# Patient Record
Sex: Male | Born: 1970 | Race: White | Hispanic: No | Marital: Married | Smoking: Never smoker
Health system: Southern US, Community
[De-identification: ages and names within clinical notes are randomized; demographics above are authoritative.]

## PROBLEM LIST (undated history)

## (undated) HISTORY — PX: APPENDECTOMY: SHX54

## (undated) HISTORY — PX: ANTERIOR CRUCIATE LIGAMENT REPAIR: SHX115

## (undated) HISTORY — PX: CERVICAL DISC SURGERY: SHX588

---

## 2008-08-11 ENCOUNTER — Ambulatory Visit (HOSPITAL_BASED_OUTPATIENT_CLINIC_OR_DEPARTMENT_OTHER): Admission: RE | Admit: 2008-08-11 | Discharge: 2008-08-11 | Payer: Self-pay | Admitting: Orthopedic Surgery

## 2010-08-02 ENCOUNTER — Other Ambulatory Visit: Payer: Self-pay | Admitting: Neurological Surgery

## 2010-08-02 ENCOUNTER — Ambulatory Visit
Admission: RE | Admit: 2010-08-02 | Discharge: 2010-08-02 | Disposition: A | Discharge status: Home / Self Care | Payer: BC Managed Care – PPO | Source: Ambulatory Visit | Attending: Neurological Surgery | Admitting: Neurological Surgery

## 2010-08-02 DIAGNOSIS — M47812 Spondylosis without myelopathy or radiculopathy, cervical region: Secondary | ICD-10-CM

## 2010-08-02 DIAGNOSIS — M79609 Pain in unspecified limb: Secondary | ICD-10-CM

## 2010-08-02 DIAGNOSIS — M5412 Radiculopathy, cervical region: Secondary | ICD-10-CM

## 2010-08-02 DIAGNOSIS — M5020 Other cervical disc displacement, unspecified cervical region: Secondary | ICD-10-CM

## 2010-08-10 NOTE — Op Note (Signed)
NAMENTHONY, LEFFERTS NO.:  [id number]   MEDICAL RECORD NO.:  [id number]          PATIENT TYPE:  AMB   LOCATION:  DSC                          FACILITY:  [facility]   PHYSICIAN:  Robert A. Thurston Hole, M.D. DATE OF BIRTH:  [birth year]   DATE OF PROCEDURE:  08/11/2008  DATE OF DISCHARGE:                               OPERATIVE REPORT   PREOPERATIVE DIAGNOSES:  1. Left knee medial and lateral meniscal tears.  2. Left knee anterior cruciate ligament graft partial tear.  3. Left knee chondromalacia.   POSTOPERATIVE DIAGNOSES:  1. Left knee medial and lateral meniscal tears.  2. Left knee anterior cruciate ligament graft partial tear.  3. Left knee chondromalacia.   PROCEDURE:  1. Left knee examination under anesthesia followed by arthroscopic      partial medial and lateral meniscectomies.  2. Left knee chondroplasty with partial synovectomy.   SURGEON:  Elana Alm. Thurston Hole, MD   ASSISTANT:  Julien Girt, PA   ANESTHESIA:  General.   OPERATIVE TIME:  40 minutes.   COMPLICATIONS:  None.   INDICATIONS FOR PROCEDURE:  Mr. Mangual is a 40 year old gentleman who  has had significant left knee pain for the past 1-2 years increasing in  nature with exam and MRI documenting meniscal tearing with  chondromalacia.  He underwent an ACL reconstruction approximately 3  years ago and by MRI exam the ACL reconstruction appears to be intact  although he has slight laxity in his knee.  He has otherwise failed  conservative care and is now to undergo arthroscopy.   DESCRIPTION:  Mr. Hedglin was brought to the operating room on Aug 11, 2008 after a knee block was placed in the holding by Anesthesia.  He was  placed on operating table in supine position.  He received Ancef 1 gram  IV preoperatively for prophylaxis.  After being placed under general  anesthesia, his left knee was examined.  He had full range of motion, 1  to 2+ Lachman with an endpoint.  Knee stable to varus  valgus and  posterior stress with normal patellar tracking with a slight pivot slip,  but not a pivot shift.  The left leg was prepped using sterile DuraPrep  and draped using sterile technique.  Originally through an anterolateral  portal, the arthroscope with a pump attached was placed into an  anteromedial portal and an arthroscopic probe was placed.  On initial  inspection of the medial compartment he was found to have 25% grade 3  chondromalacia which was debrided.  Medial meniscus showed partial  tearing of the posterior medial horn which 25-30% was resected back to a  stable rim.  The ACL graft showed that the posterior aspect was intact.  The anterior portion of the graft appeared to be torn and this was  debrided.  At least 50-60% of the graft remained intact.  He had 3 mm of  laxity but did have an endpoint.  Posterior cruciate was intact and  stable.  Lateral compartment inspected.  Grade 1 and 2 chondromalacia.  Lateral meniscus tear posterolateral horn in which 30%  was resected back  to a stable rim.  Popliteus tendon was intact.  Patellofemoral joint  showed grade 1 and 2 chondromalacia.  The patella tracked normally.  Moderate synovitis.  Medial and lateral gutters were debrided,  otherwise, this was free of pathology.  After this was done, it was felt  that all pathology have been satisfactorily addressed.  The instruments  were removed.  Portals were closed with 3-0 nylon suture.  Sterile  dressings were applied and the patient awakened and taken to recovery  room in stable condition.   FOLLOWUP CARE:  Mr. Donatelli will follow as an outpatient on Vicodin and  Mobic.  See me back in the office in a week for sutures out and  followup.      Robert A. Thurston Hole, M.D.  Electronically Signed     RAW/MEDQ  D:  08/11/2008  T:  08/12/2008  Job:  [id number]

## 2010-10-04 ENCOUNTER — Ambulatory Visit
Admission: RE | Admit: 2010-10-04 | Discharge: 2010-10-04 | Disposition: A | Discharge status: Home / Self Care | Payer: BC Managed Care – PPO | Source: Ambulatory Visit | Attending: Neurological Surgery | Admitting: Neurological Surgery

## 2010-10-04 ENCOUNTER — Other Ambulatory Visit: Payer: Self-pay | Admitting: Neurological Surgery

## 2010-10-04 DIAGNOSIS — M47812 Spondylosis without myelopathy or radiculopathy, cervical region: Secondary | ICD-10-CM

## 2014-01-22 ENCOUNTER — Other Ambulatory Visit: Payer: Self-pay | Admitting: Gastroenterology

## 2014-01-22 ENCOUNTER — Ambulatory Visit
Admission: RE | Admit: 2014-01-22 | Discharge: 2014-01-22 | Disposition: A | Discharge status: Home / Self Care | Payer: BC Managed Care – PPO | Source: Ambulatory Visit | Attending: Gastroenterology | Admitting: Gastroenterology

## 2014-01-22 DIAGNOSIS — K5901 Slow transit constipation: Secondary | ICD-10-CM

## 2014-07-02 ENCOUNTER — Other Ambulatory Visit: Payer: Self-pay | Admitting: Orthopedic Surgery

## 2014-07-02 DIAGNOSIS — M7122 Synovial cyst of popliteal space [Baker], left knee: Secondary | ICD-10-CM

## 2014-07-03 ENCOUNTER — Ambulatory Visit
Admission: RE | Admit: 2014-07-03 | Discharge: 2014-07-03 | Disposition: A | Discharge status: Home / Self Care | Payer: BLUE CROSS/BLUE SHIELD | Source: Ambulatory Visit | Attending: Orthopedic Surgery | Admitting: Orthopedic Surgery

## 2014-07-03 DIAGNOSIS — M7122 Synovial cyst of popliteal space [Baker], left knee: Secondary | ICD-10-CM

## 2015-01-20 ENCOUNTER — Other Ambulatory Visit: Payer: Self-pay | Admitting: Gastroenterology

## 2015-01-20 ENCOUNTER — Ambulatory Visit
Admission: RE | Admit: 2015-01-20 | Discharge: 2015-01-20 | Disposition: A | Discharge status: Home / Self Care | Payer: BLUE CROSS/BLUE SHIELD | Source: Ambulatory Visit | Attending: Gastroenterology | Admitting: Gastroenterology

## 2015-01-20 DIAGNOSIS — R1013 Epigastric pain: Secondary | ICD-10-CM

## 2015-01-20 DIAGNOSIS — R0789 Other chest pain: Secondary | ICD-10-CM

## 2015-01-22 ENCOUNTER — Ambulatory Visit
Admission: RE | Admit: 2015-01-22 | Discharge: 2015-01-22 | Disposition: A | Discharge status: Home / Self Care | Payer: BLUE CROSS/BLUE SHIELD | Source: Ambulatory Visit | Attending: Gastroenterology | Admitting: Gastroenterology

## 2015-01-22 DIAGNOSIS — R1013 Epigastric pain: Secondary | ICD-10-CM

## 2015-07-16 DIAGNOSIS — M47817 Spondylosis without myelopathy or radiculopathy, lumbosacral region: Secondary | ICD-10-CM | POA: Diagnosis not present

## 2015-07-16 DIAGNOSIS — M545 Low back pain: Secondary | ICD-10-CM | POA: Diagnosis not present

## 2015-07-23 DIAGNOSIS — M47817 Spondylosis without myelopathy or radiculopathy, lumbosacral region: Secondary | ICD-10-CM | POA: Diagnosis not present

## 2015-07-23 DIAGNOSIS — Z6831 Body mass index (BMI) 31.0-31.9, adult: Secondary | ICD-10-CM | POA: Diagnosis not present

## 2015-07-23 DIAGNOSIS — M545 Low back pain: Secondary | ICD-10-CM | POA: Diagnosis not present

## 2015-08-20 DIAGNOSIS — M47817 Spondylosis without myelopathy or radiculopathy, lumbosacral region: Secondary | ICD-10-CM | POA: Diagnosis not present

## 2015-09-04 ENCOUNTER — Encounter: Payer: Self-pay | Admitting: Emergency Medicine

## 2015-09-04 ENCOUNTER — Emergency Department (INDEPENDENT_AMBULATORY_CARE_PROVIDER_SITE_OTHER)
Admission: EM | Admit: 2015-09-04 | Discharge: 2015-09-04 | Disposition: A | Discharge status: Home / Self Care | Payer: BLUE CROSS/BLUE SHIELD | Source: Home / Self Care | Attending: Family Medicine | Admitting: Family Medicine

## 2015-09-04 DIAGNOSIS — H8110 Benign paroxysmal vertigo, unspecified ear: Secondary | ICD-10-CM

## 2015-09-04 MED ORDER — MECLIZINE HCL 25 MG PO TABS: 25.0000 mg | ORAL_TABLET | Freq: Three times a day (TID) | ORAL | Status: DC | PRN

## 2015-09-04 NOTE — ED Notes (Signed)
Patient states he awoke during night and when he rolled over he experienced extreme dizziness which worsened when he stood up. He states he has had minor episodes during night for about 2 months, but it always cleared when he stood up.

## 2015-09-04 NOTE — Discharge Instructions (Signed)
Benign Positional Vertigo Vertigo is the feeling that you or your surroundings are moving when they are not. Benign positional vertigo is the most common form of vertigo. The cause of this condition is not serious (is benign). This condition is triggered by certain movements and positions (is positional). This condition can be dangerous if it occurs while you are doing something that could endanger you or others, such as driving.  CAUSES In many cases, the cause of this condition is not known. It may be caused by a disturbance in an area of the inner ear that helps your brain to sense movement and balance. This disturbance can be caused by a viral infection (labyrinthitis), head injury, or repetitive motion. RISK FACTORS This condition is more likely to develop in:  Women.  People who are 50 years of age or older. SYMPTOMS Symptoms of this condition usually happen when you move your head or your eyes in different directions. Symptoms may start suddenly, and they usually last for less than a minute. Symptoms may include:  Loss of balance and falling.  Feeling like you are spinning or moving.  Feeling like your surroundings are spinning or moving.  Nausea and vomiting.  Blurred vision.  Dizziness.  Involuntary eye movement (nystagmus). Symptoms can be mild and cause only slight annoyance, or they can be severe and interfere with daily life. Episodes of benign positional vertigo may return (recur) over time, and they may be triggered by certain movements. Symptoms may improve over time. DIAGNOSIS This condition is usually diagnosed by medical history and a physical exam of the head, neck, and ears. You may be referred to a health care provider who specializes in ear, nose, and throat (ENT) problems (otolaryngologist) or a provider who specializes in disorders of the nervous system (neurologist). You may have additional testing, including:  MRI.  A CT scan.  Eye movement tests. Your  health care provider may ask you to change positions quickly while he or she watches you for symptoms of benign positional vertigo, such as nystagmus. Eye movement may be tested with an electronystagmogram (ENG), caloric stimulation, the Dix-Hallpike test, or the roll test.  An electroencephalogram (EEG). This records electrical activity in your brain.  Hearing tests. TREATMENT Usually, your health care provider will treat this by moving your head in specific positions to adjust your inner ear back to normal. Surgery may be needed in severe cases, but this is rare. In some cases, benign positional vertigo may resolve on its own in 2-4 weeks. HOME CARE INSTRUCTIONS Safety  Move slowly.Avoid sudden body or head movements.  Avoid driving.  Avoid operating heavy machinery.  Avoid doing any tasks that would be dangerous to you or others if a vertigo episode would occur.  If you have trouble walking or keeping your balance, try using a cane for stability. If you feel dizzy or unstable, sit down right away.  Return to your normal activities as told by your health care provider. Ask your health care provider what activities are safe for you. General Instructions  Take over-the-counter and prescription medicines only as told by your health care provider.  Avoid certain positions or movements as told by your health care provider.  Drink enough fluid to keep your urine clear or pale yellow.  Keep all follow-up visits as told by your health care provider. This is important. SEEK MEDICAL CARE IF:  You have a fever.  Your condition gets worse or you develop new symptoms.  Your family or friends   notice any behavioral changes.  Your nausea or vomiting gets worse.  You have numbness or a "pins and needles" sensation. SEEK IMMEDIATE MEDICAL CARE IF:  You have difficulty speaking or moving.  You are always dizzy.  You faint.  You develop severe headaches.  You have weakness in your  legs or arms.  You have changes in your hearing or vision.  You develop a stiff neck.  You develop sensitivity to light.   This information is not intended to replace advice given to you by your health care provider. Make sure you discuss any questions you have with your health care provider.   Document Released: 12/20/2005 Document Revised: 12/03/2014 Document Reviewed: 07/07/2014 Elsevier Interactive Patient Education 2016 [employer]  

## 2015-09-04 NOTE — ED Provider Notes (Signed)
CSN: [id number]     Arrival date & time 09/04/15  1303 History   First MD Initiated Contact with Patient 09/04/15 1352     Chief Complaint  Patient presents with  . Dizziness      HPI Comments: Patient reports that he awakened 2am last night and rolled over; he immediately experienced dizziness as if he were spinning, worse when he stood up.  He resumed sleeping, and at 7am today experienced similar dizziness.  No localizing neurologic symptoms.  He has had several similar brief episodes during the past two months that resolved spontaneously.  Patient is a 45 y.o. male presenting with dizziness. The history is provided by the patient.  Dizziness Quality:  Head spinning, imbalance and room spinning Severity:  Moderate Onset quality:  Sudden Duration:  11 hours Timing:  Intermittent Progression:  Improving Chronicity:  Recurrent Context: bending over, head movement and standing up   Relieved by:  Being still Worsened by:  Movement, standing up, sitting upright and turning head Associated symptoms: no chest pain, no headaches, no hearing loss, no nausea, no palpitations, no syncope, no tinnitus, no vision changes, no vomiting and no weakness   Risk factors: no hx of vertigo and no Meniere's disease     History reviewed. No pertinent past medical history. Past Surgical History  Procedure Laterality Date  . Anterior cruciate ligament repair    . Appendectomy    . Cervical disc surgery     History reviewed. No pertinent family history. Social History  Substance Use Topics  . Smoking status: Never Smoker   . Smokeless tobacco: None  . Alcohol Use: No    Review of Systems  HENT: Negative for hearing loss and tinnitus.   Cardiovascular: Negative for chest pain, palpitations and syncope.  Gastrointestinal: Negative for nausea and vomiting.  Neurological: Positive for dizziness. Negative for weakness and headaches.  All other systems reviewed and are negative.   Allergies   Morphine and related  Home Medications   Prior to Admission medications   Medication Sig Start Date End Date Taking? Authorizing Provider  meclizine (ANTIVERT) 25 MG tablet Take 1 tablet (25 mg total) by mouth 3 (three) times daily as needed for dizziness. 09/04/15   Lattie HawStephen A Sylvain Hasten, MD   Meds Ordered and Administered this Visit  Medications - No data to display  BP 117/72 mmHg  Pulse 57  Temp(Src) 97.9 F (36.6 C) (Oral)  Resp 16  Ht 6\' 3"  (1.905 m)  Wt 245 lb (111.131 kg)  BMI 30.62 kg/m2  SpO2 98% No data found.   Physical Exam Nursing notes and Vital Signs reviewed. Appearance:  Patient appears stated age, and in no acute distress Eyes:  Pupils are equal, round, and reactive to light and accomodation.  Extraocular movement is intact.  Conjunctivae are not inflamed.  Fundi benign.  No definite nystagmus.  Ears:  Canals normal.  Tympanic membranes normal.  Nose:  Normal turbinates.  No sinus tenderness.   Pharynx:  Normal Neck:  Supple.  No adenopathy  Lungs:  Clear to auscultation.  Breath sounds are equal.  Moving air well. Heart:  Regular rate and rhythm without murmurs, rubs, or gallops.  Abdomen:  Nontender without masses or hepatosplenomegaly.  Bowel sounds are present.  No CVA or flank tenderness.  Extremities:  No edema.  Skin:  No rash present.   Neurologic:  Cranial nerves 2 through 12 are normal.  Patellar, achilles, and elbow reflexes are normal.  Cerebellar function is intact (  finger-to-nose and rapid alternating hand movement).  Gait and station are normal.    ED Course  Procedures none    MDM   1. Benign paroxysmal positional vertigo, unspecified laterality    Rx for Antivert  TID Followup with ENT if not improved one week.    Lattie Haw, MD 09/12/15 1021

## 2015-11-26 DIAGNOSIS — M47817 Spondylosis without myelopathy or radiculopathy, lumbosacral region: Secondary | ICD-10-CM | POA: Diagnosis not present

## 2016-04-19 ENCOUNTER — Encounter: Payer: Self-pay | Admitting: *Deleted

## 2016-04-19 ENCOUNTER — Emergency Department (INDEPENDENT_AMBULATORY_CARE_PROVIDER_SITE_OTHER)
Admission: EM | Admit: 2016-04-19 | Discharge: 2016-04-19 | Disposition: A | Discharge status: Home / Self Care | Payer: BLUE CROSS/BLUE SHIELD | Source: Home / Self Care | Attending: Family Medicine | Admitting: Family Medicine

## 2016-04-19 DIAGNOSIS — R6889 Other general symptoms and signs: Secondary | ICD-10-CM

## 2016-04-19 DIAGNOSIS — J029 Acute pharyngitis, unspecified: Secondary | ICD-10-CM

## 2016-04-19 LAB — POCT RAPID STREP A (OFFICE): Rapid Strep A Screen: NEGATIVE

## 2016-04-19 MED ORDER — OSELTAMIVIR PHOSPHATE 75 MG PO CAPS: 75.0000 mg | ORAL_CAPSULE | Freq: Two times a day (BID) | ORAL | 0 refills | Status: DC

## 2016-04-19 NOTE — ED Triage Notes (Signed)
Pt c/o sore throat, body aches, cough and flush feeling x today. Hx of strep. Denies fever.

## 2016-04-19 NOTE — ED Provider Notes (Signed)
CSN: [id number]     Arrival date & time 04/19/16  1545 History   First MD Initiated Contact with Patient 04/19/16 1600     Chief Complaint  Patient presents with  . Sore Throat   (Consider location/radiation/quality/duration/timing/severity/associated sxs/prior Treatment) HPI  Tyler Perez is a 46 y.o. male presenting to UC with c/o sore throat, body aches, cough, feeling flush, all started today.  Hx of strep throat. Last time was about 6 months ago. He notes other coworkers have been sick but no with known strep. Denies n/v/d. He has taken ibuprofen today with moderate relief. Denies hx of asthma. Denies chest pain or SOB.   History reviewed. No pertinent past medical history. Past Surgical History:  Procedure Laterality Date  . ANTERIOR CRUCIATE LIGAMENT REPAIR    . APPENDECTOMY    . CERVICAL DISC SURGERY     History reviewed. No pertinent family history. Social History  Substance Use Topics  . Smoking status: Never Smoker  . Smokeless tobacco: Never Used  . Alcohol use No    Review of Systems  Constitutional: Negative for chills and fever.  HENT: Positive for congestion and sore throat. Negative for ear pain, trouble swallowing and voice change.   Respiratory: Positive for cough. Negative for shortness of breath.   Cardiovascular: Negative for chest pain and palpitations.  Gastrointestinal: Negative for abdominal pain, diarrhea, nausea and vomiting.  Musculoskeletal: Positive for arthralgias and myalgias. Negative for back pain.  Skin: Negative for rash.    Allergies  Morphine and related  Home Medications   Prior to Admission medications   Medication Sig Start Date End Date Taking? Authorizing Provider  oseltamivir (TAMIFLU) 75 MG capsule Take 1 capsule (75 mg total) by mouth every 12 (twelve) hours. 04/19/16   Junius FinnerErin O'Malley, PA-C   Meds Ordered and Administered this Visit  Medications - No data to display  BP 126/73 (BP Location: Left Arm)   Pulse 94   Temp  98.7 F (37.1 C) (Oral)   Resp 18   Ht 6\' 3"  (1.905 m)   Wt 248 lb (112.5 kg)   SpO2 97%   BMI 31.00 kg/m  No data found.   Physical Exam  Constitutional: He appears well-developed and well-nourished. No distress.  HENT:  Head: Normocephalic and atraumatic.  Right Ear: Tympanic membrane normal.  Left Ear: Tympanic membrane normal.  Nose: Nose normal.  Mouth/Throat: Uvula is midline and mucous membranes are normal. Posterior oropharyngeal erythema present. No oropharyngeal exudate, posterior oropharyngeal edema or tonsillar abscesses.  Eyes: Conjunctivae are normal. No scleral icterus.  Neck: Normal range of motion. Neck supple.  Cardiovascular: Normal rate, regular rhythm and normal heart sounds.   Pulmonary/Chest: Effort normal and breath sounds normal. No stridor. No respiratory distress. He has no wheezes. He has no rales.  Abdominal: Soft. He exhibits no distension. There is no tenderness.  Musculoskeletal: Normal range of motion.  Lymphadenopathy:    He has no cervical adenopathy.  Neurological: He is alert.  Skin: Skin is warm and dry. He is not diaphoretic.  Nursing note and vitals reviewed.   Urgent Care Course     Procedures (including critical care time)  Labs Review Labs Reviewed  POCT RAPID STREP A (OFFICE)    Imaging Review No results found.    MDM   1. Flu-like symptoms    Pt presenting to UC with sudden onset flu-like symptoms but pt is also concerned for strep throat.  Rapid strep: Negative  Discussed risks/benefits of Tamiflu. Pt  would like to try the treatment. Rx: Tamiflu  Advised pt to use acetaminophen and ibuprofen as needed for fever and pain. Encouraged rest and fluids. F/u with PCP in 7-10 days if not improving, sooner if worsening. Pt verbalized understanding and agreement with tx plan.    Junius Finner, PA-C 04/19/16 1624

## 2016-04-20 LAB — STREP A DNA PROBE: GASP: NOT DETECTED

## 2016-04-21 ENCOUNTER — Telehealth: Payer: Self-pay | Admitting: Emergency Medicine

## 2016-04-21 NOTE — Telephone Encounter (Signed)
Inquired about patient's status; encourage them to call with questions/concerns. Also left message that Strep DNA negative.

## 2016-04-27 ENCOUNTER — Encounter: Payer: Self-pay | Admitting: *Deleted

## 2016-04-27 ENCOUNTER — Emergency Department (INDEPENDENT_AMBULATORY_CARE_PROVIDER_SITE_OTHER)
Admission: EM | Admit: 2016-04-27 | Discharge: 2016-04-27 | Disposition: A | Discharge status: Home / Self Care | Payer: BLUE CROSS/BLUE SHIELD | Source: Home / Self Care | Attending: Family Medicine | Admitting: Family Medicine

## 2016-04-27 DIAGNOSIS — J069 Acute upper respiratory infection, unspecified: Secondary | ICD-10-CM

## 2016-04-27 DIAGNOSIS — J040 Acute laryngitis: Secondary | ICD-10-CM

## 2016-04-27 DIAGNOSIS — B9789 Other viral agents as the cause of diseases classified elsewhere: Secondary | ICD-10-CM

## 2016-04-27 MED ORDER — PREDNISONE 20 MG PO TABS: ORAL_TABLET | ORAL | 0 refills | Status: DC

## 2016-04-27 MED ORDER — BENZOCAINE-MENTHOL 15-10 MG MT LOZG: 1.0000 | LOZENGE | Freq: Four times a day (QID) | OROMUCOSAL | 0 refills | Status: DC | PRN

## 2016-04-27 MED ORDER — AZITHROMYCIN 250 MG PO TABS: 250.0000 mg | ORAL_TABLET | Freq: Every day | ORAL | 0 refills | Status: DC

## 2016-04-27 NOTE — ED Provider Notes (Signed)
CSN: [id number]     Arrival date & time 04/27/16  1154 History   First MD Initiated Contact with Patient 04/27/16 1236     Chief Complaint  Patient presents with  . Hoarse   (Consider location/radiation/quality/duration/timing/severity/associated sxs/prior Treatment) HPI Tyler PlowmanJohn Perez is a 46 y.o. male presenting to UC with c/o dry, red worsening sore throat for 4 days, associated hoarse voice.  He was seen at [facility] 4 days ago for flu-like symptoms and completed a course of Tamiflu.  He notes his body aches and fever have resolved but the hoarseness in his voice and throat are most concerning for him. He did have a negative strep test the other day [facility]. Denies n/v/d. He has been able to keep down fluids today.      History reviewed. No pertinent past medical history. Past Surgical History:  Procedure Laterality Date  . ANTERIOR CRUCIATE LIGAMENT REPAIR    . APPENDECTOMY    . CERVICAL DISC SURGERY     History reviewed. No pertinent family history. Social History  Substance Use Topics  . Smoking status: Never Smoker  . Smokeless tobacco: Never Used  . Alcohol use No    Review of Systems  Constitutional: Negative for chills and fever.  HENT: Positive for congestion, sore throat and voice change. Negative for ear pain and trouble swallowing.   Respiratory: Positive for cough. Negative for shortness of breath.   Cardiovascular: Negative for chest pain and palpitations.  Gastrointestinal: Negative for abdominal pain, diarrhea, nausea and vomiting.  Musculoskeletal: Negative for arthralgias, back pain and myalgias.  Skin: Negative for rash.    Allergies  Morphine and related  Home Medications   Prior to Admission medications   Medication Sig Start Date End Date Taking? Authorizing Provider  azithromycin (ZITHROMAX) 250 MG tablet Take 1 tablet (250 mg total) by mouth daily. Take first 2 tablets together, then 1 every day until finished. 04/27/16   Junius FinnerErin O'Malley, PA-C   Benzocaine-Menthol 15-10 MG LOZG Use as directed 1 lozenge in the mouth or throat 4 (four) times daily as needed. 04/27/16   Junius FinnerErin O'Malley, PA-C  predniSONE (DELTASONE) 20 MG tablet 3 tabs po day one, then 2 po daily x 4 days 04/27/16   Junius FinnerErin O'Malley, PA-C   Meds Ordered and Administered this Visit  Medications - No data to display  BP 111/72 (BP Location: Left Arm)   Pulse (!) 55   Temp 98.7 F (37.1 C) (Oral)   Resp 16   Ht 6\' 3"  (1.905 m)   Wt 245 lb (111.1 kg)   SpO2 98%   BMI 30.62 kg/m  No data found.   Physical Exam  Constitutional: He is oriented to person, place, and time. He appears well-developed and well-nourished. No distress.  HENT:  Head: Normocephalic and atraumatic.  Right Ear: Tympanic membrane normal.  Left Ear: Tympanic membrane normal.  Nose: Nose normal.  Mouth/Throat: Uvula is midline and mucous membranes are normal. Posterior oropharyngeal erythema present.  Eyes: EOM are normal.  Neck: Normal range of motion. Neck supple.  Hoarse voice, no stridor.  Cardiovascular: Normal rate and regular rhythm.   Pulmonary/Chest: Effort normal. No stridor. No respiratory distress. He has no wheezes. He has no rales.  Musculoskeletal: Normal range of motion.  Lymphadenopathy:    He has no cervical adenopathy.  Neurological: He is alert and oriented to person, place, and time.  Skin: Skin is warm and dry. He is not diaphoretic.  Psychiatric: He has a  normal mood and affect. His behavior is normal.  Nursing note and vitals reviewed.   Urgent Care Course     Procedures (including critical care time)  Labs Review Labs Reviewed - No data to display  Imaging Review No results found.    MDM   1. Laryngitis   2. Viral upper respiratory tract infection with cough    Pt c/o worsening hoarse voice. Recently dx with flu-like symptoms, treated with Tamiflu. Rapid strep was negative the other day.  Hx and exam c/w laryngitis likely from viral illness. Rx:  Prednisone and benzocaine-menthol throat lozenges. Prescription to hold with expiration date for azithromycin. Pt to fill if persistent fever develops or not improving in 1 week.      Junius Finner, PA-C 04/27/16 1335

## 2016-04-27 NOTE — Discharge Instructions (Signed)
°  Your symptoms are likely due to a virus such as the common cold, however, if you developing worsening chest congestion with shortness of breath, persistent fever for 3 days, or symptoms not improving in 4-5 days, you may fill the antibiotic (azithromycin).  If you do fill the antibiotic,  please take antibiotics as prescribed and be sure to complete entire course even if you start to feel better to ensure infection does not come back. ° °

## 2016-04-27 NOTE — ED Triage Notes (Signed)
Pt c/o dry/red throat with hoarseness x 4 days. He reports productive cough at times.

## 2016-05-11 DIAGNOSIS — H8309 Labyrinthitis, unspecified ear: Secondary | ICD-10-CM | POA: Diagnosis not present

## 2016-05-11 DIAGNOSIS — L739 Follicular disorder, unspecified: Secondary | ICD-10-CM | POA: Diagnosis not present

## 2016-05-23 DIAGNOSIS — B9689 Other specified bacterial agents as the cause of diseases classified elsewhere: Secondary | ICD-10-CM | POA: Diagnosis not present

## 2016-05-23 DIAGNOSIS — L02229 Furuncle of trunk, unspecified: Secondary | ICD-10-CM | POA: Diagnosis not present

## 2016-05-23 DIAGNOSIS — L738 Other specified follicular disorders: Secondary | ICD-10-CM | POA: Diagnosis not present

## 2016-05-24 DIAGNOSIS — M47817 Spondylosis without myelopathy or radiculopathy, lumbosacral region: Secondary | ICD-10-CM | POA: Diagnosis not present

## 2016-09-01 DIAGNOSIS — M25562 Pain in left knee: Secondary | ICD-10-CM | POA: Diagnosis not present

## 2016-09-06 DIAGNOSIS — R030 Elevated blood-pressure reading, without diagnosis of hypertension: Secondary | ICD-10-CM | POA: Diagnosis not present

## 2016-09-06 DIAGNOSIS — M47817 Spondylosis without myelopathy or radiculopathy, lumbosacral region: Secondary | ICD-10-CM | POA: Diagnosis not present

## 2016-09-06 DIAGNOSIS — Z6830 Body mass index (BMI) 30.0-30.9, adult: Secondary | ICD-10-CM | POA: Diagnosis not present

## 2016-09-29 DIAGNOSIS — M47817 Spondylosis without myelopathy or radiculopathy, lumbosacral region: Secondary | ICD-10-CM | POA: Diagnosis not present

## 2016-09-29 DIAGNOSIS — M545 Low back pain: Secondary | ICD-10-CM | POA: Diagnosis not present

## 2016-10-11 ENCOUNTER — Other Ambulatory Visit: Payer: Self-pay | Admitting: Neurological Surgery

## 2016-10-11 ENCOUNTER — Ambulatory Visit (INDEPENDENT_AMBULATORY_CARE_PROVIDER_SITE_OTHER): Payer: BLUE CROSS/BLUE SHIELD

## 2016-10-11 DIAGNOSIS — M5440 Lumbago with sciatica, unspecified side: Secondary | ICD-10-CM | POA: Diagnosis not present

## 2016-10-11 DIAGNOSIS — Z6831 Body mass index (BMI) 31.0-31.9, adult: Secondary | ICD-10-CM | POA: Diagnosis not present

## 2016-10-11 DIAGNOSIS — M545 Low back pain: Secondary | ICD-10-CM | POA: Diagnosis not present

## 2016-10-11 DIAGNOSIS — R030 Elevated blood-pressure reading, without diagnosis of hypertension: Secondary | ICD-10-CM | POA: Diagnosis not present

## 2017-02-15 DIAGNOSIS — Z23 Encounter for immunization: Secondary | ICD-10-CM | POA: Diagnosis not present

## 2017-03-30 ENCOUNTER — Emergency Department (HOSPITAL_COMMUNITY)
Admission: EM | Admit: 2017-03-30 | Discharge: 2017-03-30 | Discharge status: Against Medical Advice | Payer: BLUE CROSS/BLUE SHIELD

## 2017-03-30 DIAGNOSIS — M25571 Pain in right ankle and joints of right foot: Secondary | ICD-10-CM | POA: Diagnosis not present

## 2017-04-06 DIAGNOSIS — M25571 Pain in right ankle and joints of right foot: Secondary | ICD-10-CM | POA: Diagnosis not present

## 2017-04-11 DIAGNOSIS — M7541 Impingement syndrome of right shoulder: Secondary | ICD-10-CM | POA: Diagnosis not present

## 2018-02-01 IMAGING — DX DG LUMBAR SPINE COMPLETE 4+V
4 series · 4 of 4 positions shown · non-contrast
Comparison: 09/29/2016 lumbar spine MR.

CLINICAL DATA: 46-year-old male with low back pain and sciatica.
Subsequent encounter.

EXAM:
LUMBAR SPINE - COMPLETE 4+ VIEW

[l-spine ap]
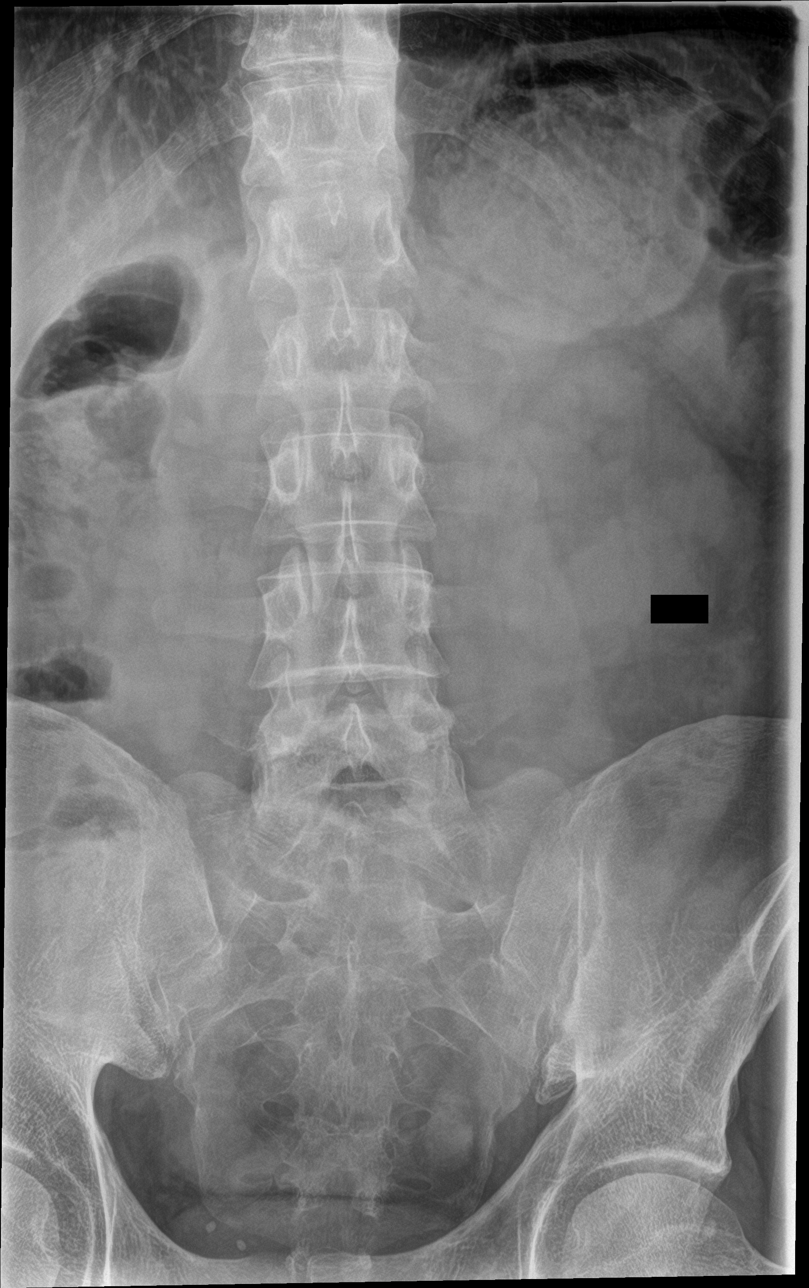

[l-spine lat]
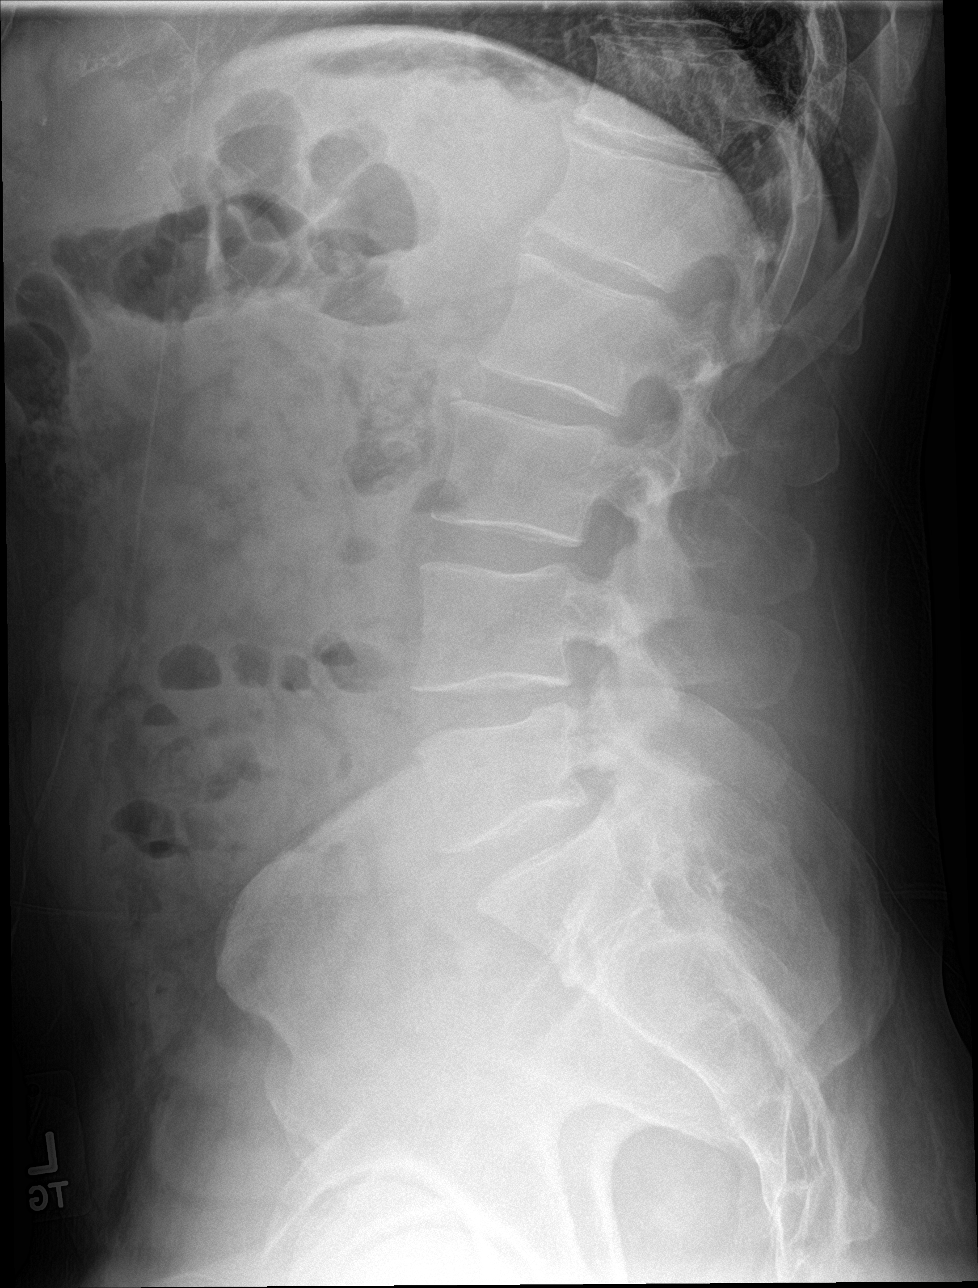

[l-spine flex]
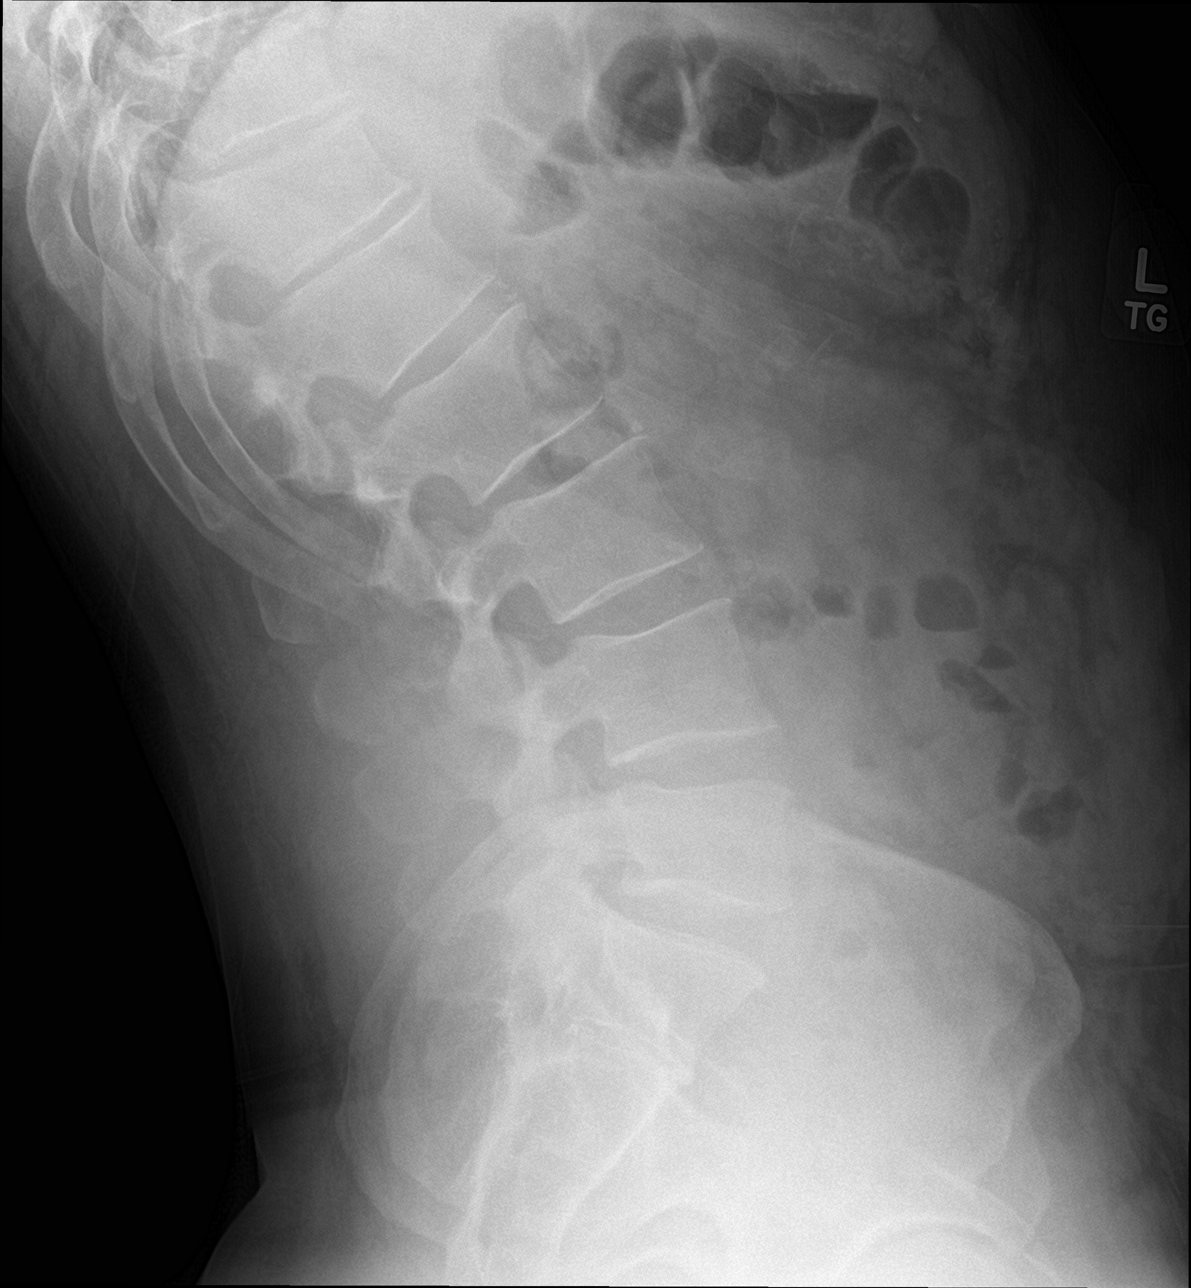

[l-spine ext]
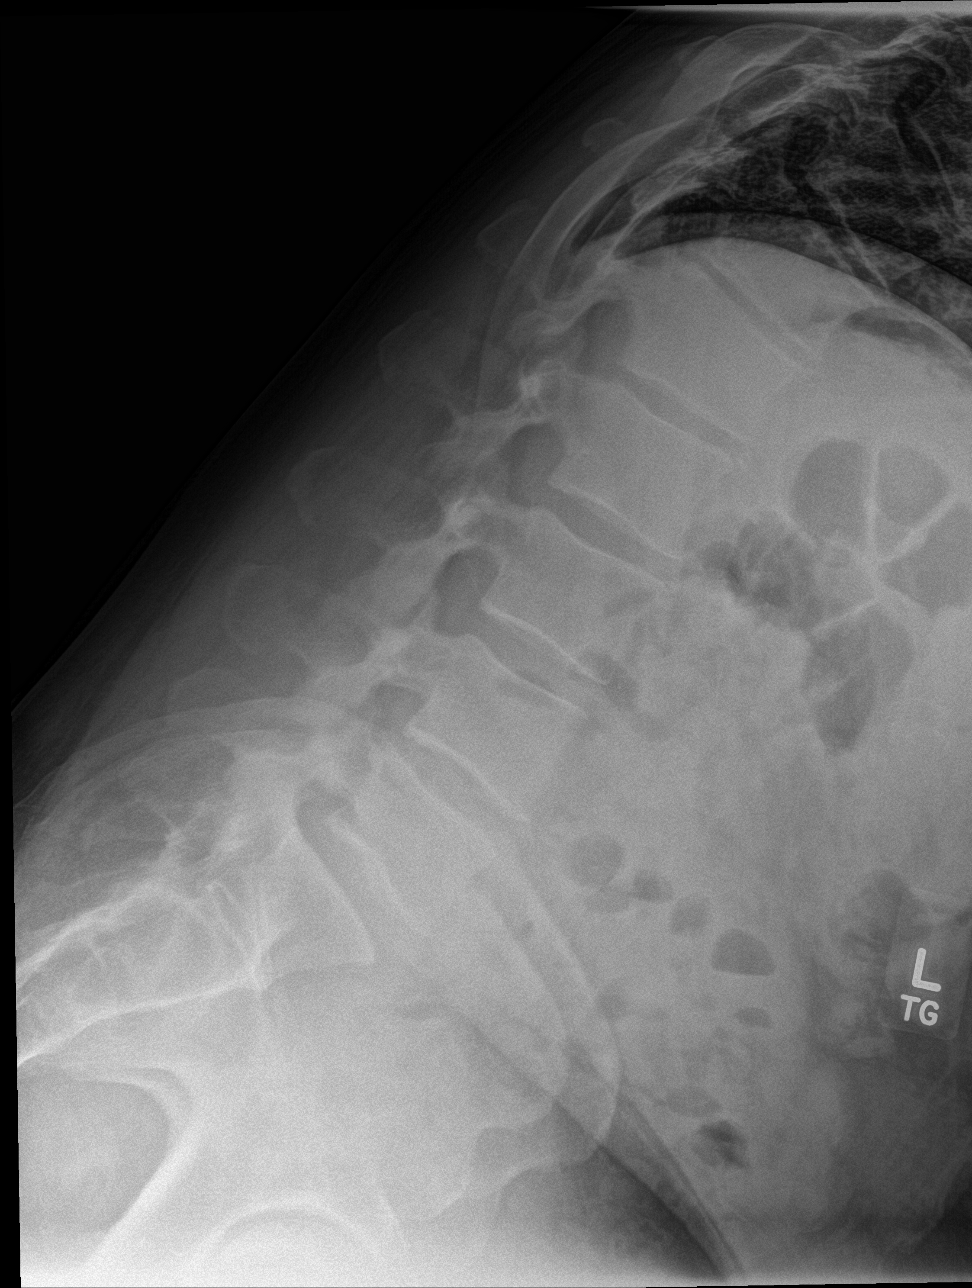

[4 of 4 positions shown; findings below may reference images not displayed]

FINDINGS: Rudimentary disc at S1-2 level. Last fully open disc space L5-S1 as
on prior MR.

No significant lumbar disc space narrowing.

No abnormal motion between flexion extension.

Mild degenerative changes lower thoracic spine.
IMPRESSION: No abnormal motion between flexion and extension.

## 2019-06-17 ENCOUNTER — Encounter (HOSPITAL_COMMUNITY): Payer: Self-pay

## 2019-06-17 ENCOUNTER — Emergency Department (HOSPITAL_COMMUNITY)
Admission: EM | Admit: 2019-06-17 | Discharge: 2019-06-17 | Disposition: A | Discharge status: Home / Self Care | Payer: 59 | Attending: Emergency Medicine | Admitting: Emergency Medicine

## 2019-06-17 DIAGNOSIS — R110 Nausea: Secondary | ICD-10-CM | POA: Insufficient documentation

## 2019-06-17 DIAGNOSIS — U071 COVID-19: Secondary | ICD-10-CM | POA: Insufficient documentation

## 2019-06-17 DIAGNOSIS — R197 Diarrhea, unspecified: Secondary | ICD-10-CM | POA: Diagnosis not present

## 2019-06-17 DIAGNOSIS — R109 Unspecified abdominal pain: Secondary | ICD-10-CM | POA: Diagnosis not present

## 2019-06-17 DIAGNOSIS — Z5321 Procedure and treatment not carried out due to patient leaving prior to being seen by health care provider: Secondary | ICD-10-CM | POA: Diagnosis not present

## 2019-06-17 LAB — COMPREHENSIVE METABOLIC PANEL
ALT: 39 U/L (ref 0–44)
AST: 35 U/L (ref 15–41)
Albumin: 3.9 g/dL (ref 3.5–5.0)
Alkaline Phosphatase: 46 U/L (ref 38–126)
Anion gap: 11 (ref 5–15)
BUN: 13 mg/dL (ref 6–20)
CO2: 26 mmol/L (ref 22–32)
Calcium: 9 mg/dL (ref 8.9–10.3)
Chloride: 101 mmol/L (ref 98–111)
Creatinine, Ser: 0.9 mg/dL (ref 0.61–1.24)
GFR calc Af Amer: >60 mL/min (ref >60)
GFR calc non Af Amer: >60 mL/min (ref >60)
Glucose, Bld: 100 mg/dL — ABNORMAL HIGH (ref 70–99)
Potassium: 4 mmol/L (ref 3.5–5.1)
Sodium: 138 mmol/L (ref 135–145)
Total Bilirubin: 0.8 mg/dL (ref 0.3–1.2)
Total Protein: 7.3 g/dL (ref 6.5–8.1)

## 2019-06-17 LAB — CBC
HCT: 48.2 % (ref 39.0–52.0)
Hemoglobin: 16 g/dL (ref 13.0–17.0)
MCH: 29.5 pg (ref 26.0–34.0)
MCHC: 33.2 g/dL (ref 30.0–36.0)
MCV: 88.8 fL (ref 80.0–100.0)
Platelets: 147 10*3/uL — ABNORMAL LOW (ref 150–400)
RBC: 5.43 MIL/uL (ref 4.22–5.81)
RDW: 12.6 % (ref 11.5–15.5)
WBC: 5.4 10*3/uL (ref 4.0–10.5)
nRBC: 0 % (ref 0.0–0.2)

## 2019-06-17 LAB — LIPASE, BLOOD: Lipase: 23 U/L (ref 11–51)

## 2019-06-17 MED ORDER — SODIUM CHLORIDE 0.9% FLUSH: 3.0000 mL | Freq: Once | INTRAVENOUS | Status: DC

## 2019-06-17 NOTE — ED Triage Notes (Signed)
Pt report that he tested covid + today and has been having severe abd pain today with diarrhea, and nausea

## 2019-06-17 NOTE — ED Notes (Addendum)
PT said he didn't want to wait any longer

## 2022-04-21 ENCOUNTER — Other Ambulatory Visit: Payer: Self-pay

## 2022-04-21 DIAGNOSIS — E7800 Pure hypercholesterolemia, unspecified: Secondary | ICD-10-CM

## 2022-05-05 ENCOUNTER — Ambulatory Visit (HOSPITAL_COMMUNITY)
Admission: RE | Admit: 2022-05-05 | Discharge: 2022-05-05 | Disposition: A | Discharge status: Home / Self Care | Payer: Self-pay | Source: Ambulatory Visit | Attending: Family Medicine | Admitting: Family Medicine

## 2022-05-05 DIAGNOSIS — E7800 Pure hypercholesterolemia, unspecified: Secondary | ICD-10-CM | POA: Insufficient documentation

## 2024-02-15 ENCOUNTER — Encounter: Payer: Self-pay | Admitting: Podiatry

## 2024-02-15 ENCOUNTER — Ambulatory Visit: Admitting: Podiatry

## 2024-02-15 VITALS — Ht 75.0 in | Wt 245.0 lb

## 2024-02-15 DIAGNOSIS — L600 Ingrowing nail: Secondary | ICD-10-CM

## 2024-02-15 NOTE — Patient Instructions (Signed)

## 2024-02-16 NOTE — Progress Notes (Signed)
 Subjective:   Patient ID: Tyler Perez, male   DOB: 53 y.o.   MRN: [id number]   HPI Patient presents with painful ingrown toenail left big toe medial border that is been going on for a fairly long.  Of time and is making shoe gear painful with no active drainage.  Patient does not smoke likes to be active   Review of Systems  All other systems reviewed and are negative.       Objective:  Physical Exam Vitals and nursing note reviewed.  Constitutional:      Appearance: He is well-developed.  Pulmonary:     Effort: Pulmonary effort is normal.  Musculoskeletal:        General: Normal range of motion.  Skin:    General: Skin is warm.  Neurological:     Mental Status: He is alert.     Neurovascular status was found to be intact and muscle strength found to be adequate range of motion adequate with incurvated medial border left big toenail that is painful when pressed and make shoe gear difficult.  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  I infiltrated the left big toe 60 mg Xylocaine Marcaine mixture sterile prep done using sterile instrumentation removed the medial border exposed matrix applied phenol 3 applications 30 seconds followed by alcohol lavage sterile dressing gave instructions on soaks wear dressing 24 hours taken off earlier if throbbing were to occur and encouraged to call with questions during recovery

## 2024-04-19 ENCOUNTER — Encounter (HOSPITAL_BASED_OUTPATIENT_CLINIC_OR_DEPARTMENT_OTHER): Payer: Self-pay | Admitting: Emergency Medicine

## 2024-04-19 ENCOUNTER — Emergency Department (HOSPITAL_BASED_OUTPATIENT_CLINIC_OR_DEPARTMENT_OTHER): Admission: EM | Admit: 2024-04-19 | Discharge: 2024-04-19 | Disposition: A | Discharge status: Home / Self Care

## 2024-04-19 ENCOUNTER — Other Ambulatory Visit: Payer: Self-pay

## 2024-04-19 DIAGNOSIS — W260XXA Contact with knife, initial encounter: Secondary | ICD-10-CM | POA: Diagnosis not present

## 2024-04-19 DIAGNOSIS — S61211A Laceration without foreign body of left index finger without damage to nail, initial encounter: Secondary | ICD-10-CM | POA: Insufficient documentation

## 2024-04-19 DIAGNOSIS — Y93G1 Activity, food preparation and clean up: Secondary | ICD-10-CM | POA: Diagnosis not present

## 2024-04-19 DIAGNOSIS — S65511A Laceration of blood vessel of left index finger, initial encounter: Secondary | ICD-10-CM

## 2024-04-19 DIAGNOSIS — S6992XA Unspecified injury of left wrist, hand and finger(s), initial encounter: Secondary | ICD-10-CM | POA: Diagnosis present

## 2024-04-19 MED ORDER — TETANUS-DIPHTH-ACELL PERTUSSIS 5-2-15.5 LF-MCG/0.5 IM SUSP
0.5000 mL | Freq: Once | INTRAMUSCULAR | Status: DC
  Filled 2024-04-19: qty 0.5

## 2024-04-19 MED ORDER — LIDOCAINE-EPINEPHRINE (PF) 2 %-1:200000 IJ SOLN
10.0000 mL | Freq: Once | INTRAMUSCULAR | Status: AC
  Administered 2024-04-19: 10 mL
  Filled 2024-04-19: qty 20

## 2024-04-19 NOTE — ED Provider Notes (Signed)
 " [facility] Provider Note   CSN: [id number] Arrival date & time: 04/19/24  2016     Patient presents with: Finger Injury   Minas Bonser is a 54 y.o. male with no pertinent past medical history, who presents emergency department for evaluation of a left finger laceration.  Patient was reportedly cutting a sweet potato, when he had to push down heavy with a knife, ultimately cutting part of his index finger.  He reports significant bleeding after the incident.  He did wrap the finger, prior to arrival but it was rewrapped in triage.  Patient's last tetanus shot was in 2023.  HPI     Prior to Admission medications  Not on File    Allergies: Morphine and codeine    Review of Systems  Skin:  Positive for wound.    Updated Vital Signs BP 131/75 (BP Location: Right Arm)   Pulse (!) 53   Temp 97.8 F (36.6 C) (Oral)   Resp 17   SpO2 97%   Physical Exam Vitals and nursing note reviewed.  Constitutional:      Appearance: Normal appearance. He is not ill-appearing.  Eyes:     General: No scleral icterus. Pulmonary:     Effort: Pulmonary effort is normal. No respiratory distress.  Musculoskeletal:        General: Tenderness and signs of injury present. No deformity.     Comments: Patient with approximately 3 cm laceration to his left index finger.  There is a flap of skin that is loosely approximated.  Wound is continuing to bleed, but pulsating is not noted.  Patient has full range of motion of his finger.  He is able to bend the finger as well as resisted strength.  Radial pulses intact.  Skin:    Coloration: Skin is not jaundiced.  Neurological:     General: No focal deficit present.     Mental Status: He is alert.  Psychiatric:        Mood and Affect: Mood normal.     (all labs ordered are listed, but only abnormal results are displayed) Labs Reviewed - No data to display  EKG: None  Radiology: No results  found.   Arturo Block  Date/Time: 04/20/2024 12:00 AM  Performed by: Torrence Marry RAMAN, PA-C Authorized by: Torrence Marry RAMAN, PA-C   Consent:    Consent obtained:  Verbal   Consent given by:  Patient   Risks, benefits, and alternatives were discussed: yes     Risks discussed:  Infection, pain, unsuccessful block, swelling and bleeding   Alternatives discussed:  No treatment Universal protocol:    Procedure explained and questions answered to patient or proxy's satisfaction: yes     Patient identity confirmed:  Verbally with patient Indications:    Indications:  Pain relief and procedural anesthesia Location:    Body area:  Upper extremity   Upper extremity nerve:  Metacarpal   Laterality:  Left Pre-procedure details:    Skin preparation:  Povidone-iodine Skin anesthesia:    Skin anesthesia method:  None Procedure details:    Block needle gauge:  25 G   Anesthetic injected:  10 mL lidocaine -EPINEPHrine  2 %-1:200000   Steroid injected:  None   Additive injected:  None   Injection procedure:  Anatomic landmarks palpated, anatomic landmarks identified, incremental injection and negative aspiration for blood   Paresthesia:  None Post-procedure details:    Dressing:  None   Outcome:  Pain relieved  Procedure completion:  Tolerated well, no immediate complications .Laceration Repair  Date/Time: 04/20/2024 12:01 AM  Performed by: Torrence Marry RAMAN, PA-C Authorized by: Torrence Marry RAMAN, PA-C   Consent:    Consent obtained:  Verbal   Consent given by:  Patient   Risks, benefits, and alternatives were discussed: yes     Risks discussed:  Infection, pain and poor cosmetic result   Alternatives discussed:  No treatment Universal protocol:    Procedure explained and questions answered to patient or proxy's satisfaction: yes     Patient identity confirmed:  Verbally with patient Anesthesia:    Anesthesia method:  Nerve block   Block location:  Median nerve   Block  needle gauge:  25 G   Block anesthetic:  Lidocaine  1% WITH epi   Block technique:  Ring block   Block injection procedure:  Anatomic landmarks palpated, anatomic landmarks identified, introduced needle and negative aspiration for blood   Block outcome:  Anesthesia achieved Laceration details:    Location:  Hand   Hand location:  L hand, dorsum   Length (cm):  3 Exploration:    Limited defect created (wound extended): yes     Hemostasis achieved with:  Tourniquet and direct pressure   Imaging outcome: foreign body not noted     Wound exploration: wound explored through full range of motion     Contaminated: yes   Treatment:    Area cleansed with:  Povidone-iodine and saline   Amount of cleaning:  Extensive   Irrigation solution:  Sterile saline   Irrigation volume:  250   Irrigation method: Soak.   Visualized foreign bodies/material removed: no     Debridement:  Minimal   Undermining:  None   Scar revision: no   Skin repair:    Repair method:  Sutures   Suture size:  3-0 and 4-0   Suture material:  Prolene   Suture technique:  Simple interrupted   Number of sutures:  4 Approximation:    Approximation:  Close Repair type:    Repair type:  Simple Post-procedure details:    Dressing:  Antibiotic ointment   Procedure completion:  Tolerated well, no immediate complications    Medications Ordered in the ED  Tdap (ADACEL ) injection 0.5 mL (0.5 mLs Intramuscular Patient Refused/Not Given 04/19/24 2149)  lidocaine -EPINEPHrine  (XYLOCAINE  W/EPI) 2 %-1:200000 (PF) injection 10 mL (10 mLs Infiltration Given 04/19/24 2302)                                 Medical Decision Making Risk Prescription drug management.   54 year old male who presents emergency department for evaluation of a left index finger laceration.  Differential diagnoses: Laceration, tendon injury, arterial injury, superficial abrasion, fracture.  Patient does have full range of motion to his finger, so tendon  injury is unlikely.  There is no evidence of swelling or exposed bone, making fracture less likely.  Initially, bleeding is somewhat difficult to control, but once the wound has been cleaned and a finger tourniquet has been placed, I am able to get a better visualization of the injury.  Patient does appear to have a laceration that is deep enough to require suturing.  I performed a nerve block to ease patient's pain and then placed four stitches.  Please see procedural notes above.  Tetanus shot is up-to-date.  Patient tolerated both procedures well.  He has been instructed to come to his PCP,  urgent care or emergency department for suture removal in 7 to 10 days.  Wound care instructions have been given.  Patient verbalizes understanding to this.  Patient's vital signs are stable.  Patient is appropriate for discharge at this time.   Final diagnoses:  Laceration of blood vessel of left index finger, initial encounter    ED Discharge Orders     None          Torrence Marry RAMAN, PA-C 04/20/24 0003  "

## 2024-04-19 NOTE — ED Triage Notes (Signed)
 Lac to left pointer finger Cutting potatoes and sliced finger around 8 pm Unknown tetanus Rinsed and pressure dressed in triage

## 2024-04-19 NOTE — Discharge Instructions (Signed)
 Was a pleasure taking care of you today.  Your workup was reassuring.  You were seen in the emergency department for evaluation of a laceration of your left index finger.  I did ultimately end up placing 4 sutures, which you need to have removed in 7 to 10 days.  You may do this at any emergency department, urgent care or your primary care's office.  If you have continued pain in your finger, you may take Tylenol and/or ibuprofen every 6 hours as needed.  You did receive a nerve block today, which should last approximately 3 to 4 hours.  Please return to the emergency department for any worsening symptoms or other life-threatening illness.

## 2024-04-20 MED ORDER — LIDOCAINE-EPINEPHRINE (PF) 2 %-1:200000 IJ SOLN
10.0000 mL | INTRAMUSCULAR | Status: DC
  Administered 2024-04-20: 10 mL
